# Patient Record
Sex: Female | Born: 2000 | Race: White | Hispanic: No | Marital: Single | Smoking: Never smoker
Health system: Southern US, Community
[De-identification: ages and names within clinical notes are randomized; demographics above are authoritative.]

## PROBLEM LIST (undated history)

## (undated) DIAGNOSIS — F419 Anxiety disorder, unspecified: Secondary | ICD-10-CM

---

## 2019-09-07 ENCOUNTER — Emergency Department (HOSPITAL_BASED_OUTPATIENT_CLINIC_OR_DEPARTMENT_OTHER): Payer: PRIVATE HEALTH INSURANCE

## 2019-09-07 ENCOUNTER — Other Ambulatory Visit: Payer: Self-pay

## 2019-09-07 ENCOUNTER — Emergency Department (HOSPITAL_BASED_OUTPATIENT_CLINIC_OR_DEPARTMENT_OTHER)
Admission: EM | Admit: 2019-09-07 | Discharge: 2019-09-07 | Disposition: A | Discharge status: Home / Self Care | Payer: PRIVATE HEALTH INSURANCE | Attending: Emergency Medicine | Admitting: Emergency Medicine

## 2019-09-07 ENCOUNTER — Encounter (HOSPITAL_BASED_OUTPATIENT_CLINIC_OR_DEPARTMENT_OTHER): Payer: Self-pay | Admitting: Emergency Medicine

## 2019-09-07 DIAGNOSIS — Y999 Unspecified external cause status: Secondary | ICD-10-CM | POA: Insufficient documentation

## 2019-09-07 DIAGNOSIS — Y9389 Activity, other specified: Secondary | ICD-10-CM | POA: Diagnosis not present

## 2019-09-07 DIAGNOSIS — S9002XA Contusion of left ankle, initial encounter: Secondary | ICD-10-CM

## 2019-09-07 DIAGNOSIS — Y9289 Other specified places as the place of occurrence of the external cause: Secondary | ICD-10-CM | POA: Insufficient documentation

## 2019-09-07 DIAGNOSIS — S99912A Unspecified injury of left ankle, initial encounter: Secondary | ICD-10-CM | POA: Diagnosis present

## 2019-09-07 DIAGNOSIS — Z79899 Other long term (current) drug therapy: Secondary | ICD-10-CM | POA: Insufficient documentation

## 2019-09-07 DIAGNOSIS — W208XXA Other cause of strike by thrown, projected or falling object, initial encounter: Secondary | ICD-10-CM | POA: Insufficient documentation

## 2019-09-07 HISTORY — DX: Anxiety disorder, unspecified: F41.9

## 2019-09-07 NOTE — ED Triage Notes (Signed)
Reports a large plank fell on her left ankle around 3pm today.  C/o pain and swelling.

## 2019-09-07 NOTE — Discharge Instructions (Signed)
You may take Tylenol 1000 mg every 6 hours as needed for pain and alternate with ibuprofen 800 mg every 8 hours as needed for pain.  Your x-ray showed no fracture or dislocation.  You may use the Ace wrap to help with pain and swelling.  You may use crutches as needed but you are safe to weight-bear as tolerated.  I suspect that as your soft tissue swelling improves, your numbness will also improve.  I recommend follow-up with sports medicine if symptoms do not seem to be improving with medical therapy.

## 2019-09-07 NOTE — ED Provider Notes (Signed)
TIME SEEN: 1:04 AM  CHIEF COMPLAINT: Left ankle injury  HPI: Patient is a 19 year old female who presents to the emergency department left ankle injury that occurred at 3 PM yesterday afternoon.  States that a large board fell onto her ankle and the back of her Achilles.  She was able to ambulate afterwards.  States she has been using ice and symptoms are improving.  States that she got into a hot tub tonight and pain worsened and it began throbbing.  She feels like her foot is numb.  She has pain now with ambulation.  ROS: See HPI Constitutional: no fever  Eyes: no drainage  ENT: no runny nose   Cardiovascular:  no chest pain  Resp: no SOB  GI: no vomiting GU: no dysuria Integumentary: no rash  Allergy: no hives  Musculoskeletal: no leg swelling  Neurological: no slurred speech ROS otherwise negative  PAST MEDICAL HISTORY/PAST SURGICAL HISTORY:  Past Medical History:  Diagnosis Date  . Anxiety     MEDICATIONS:  Prior to Admission medications   Medication Sig Start Date End Date Taking? Authorizing Provider  ALPRAZolam Duanne Moron) 1 MG tablet Take 1 mg by mouth at bedtime as needed for anxiety.   Yes [provider]  desvenlafaxine (PRISTIQ) 50 MG 24 hr tablet Take 50 mg by mouth daily.   Yes [provider]  methylphenidate 36 MG PO CR tablet Take 78 mg by mouth daily.   Yes [provider]  norethindrone-ethinyl estradiol (LOESTRIN) 1-20 MG-MCG tablet Take 1 tablet by mouth daily.   Yes [provider]    ALLERGIES:  Not on File  SOCIAL HISTORY:  Social History   Tobacco Use  . Smoking status: Never Smoker  . Smokeless tobacco: Never Used  Substance Use Topics  . Alcohol use: Yes    Comment: occasionally    FAMILY HISTORY: No family history on file.  EXAM: BP 133/73 (BP Location: Right Arm)   Pulse (!) 115   Temp 98.4 F (36.9 C) (Oral)   Resp 16   Ht 5\' 5"  (1.651 m)   Wt 59 kg   SpO2 98%   BMI 21.63 kg/m   CONSTITUTIONAL: Alert and oriented and responds appropriately to questions. Well-appearing; well-nourished, nontoxic, in no distress HEAD: Normocephalic EYES: Conjunctivae clear, pupils appear equal, EOM appear intact ENT: normal nose; moist mucous membranes NECK: Supple, normal ROM CARD: Regular and tachycardic; S1 and S2 appreciated; no murmurs, no clicks, no rubs, no gallops RESP: Normal chest excursion without splinting or tachypnea; breath sounds clear and equal bilaterally; no wheezes, no rhonchi, no rales, no hypoxia or respiratory distress, speaking full sentences ABD/GI: Normal bowel sounds; non-distended; soft, non-tender, no rebound, no guarding, no peritoneal signs, no hepatosplenomegaly BACK:  The back appears normal EXT: Patient has soft tissue contusion with abrasion and mild ecchymosis to the medial ankle and over the left Achilles.  Small amount of soft tissue swelling noted.  She has full dorsi and plantar flexion of the left foot.  Normal movement of the left toes.  No ligamentous laxity.  2+ left DP pulse.  Normal capillary refill.  Reports diminished sensation from ankle down her left foot.  No calf tenderness or calf swelling.  No tenderness or deformity over the proximal left fibula.  No redness or warmth.  No fluctuance or induration. SKIN: Normal color for age and race; warm; no rash on exposed skin NEURO: Moves all extremities equally, reports diminished sensation in the ankle and left foot but  normal sensation otherwise in the left leg, normal speech PSYCH: The patient's mood and manner are appropriate.   MEDICAL DECISION MAKING: Patient here with left ankle injury.  She reports feeling that this area is numb but I suspect this is due to soft tissue swelling, neuropraxia from contusion.  She has no lacerations.  Doubt stroke or nerve laceration.  Doubt DVT.  She has a strong DP pulse and normal capillary refill.  No ligamentous laxity on exam and no sign of Achilles  rupture.  X-ray here shows no fracture, dislocation and has been independently reviewed and interpreted by myself.  Offered Ace wrap for comfort and compression.  Will provide crutches to use as needed.  Recommended alternating Tylenol and Motrin for pain.  Offered pain medication here which she declines.  No other sign of injury on exam.  I feel she is safe to be discharged home and can follow-up with sports medicine as needed if symptoms not improving.  At this time, I do not feel there is any life-threatening condition present. I have reviewed, interpreted and discussed all results (EKG, imaging, lab, urine as appropriate) and exam findings with patient/family. I have reviewed nursing notes and appropriate previous records.  I feel the patient is safe to be discharged home without further emergent workup and can continue workup as an outpatient as needed. Discussed usual and customary return precautions. Patient/family verbalize understanding and are comfortable with this plan.  Outpatient follow-up has been provided as needed. All questions have been answered.      Kendra Mills was evaluated in Emergency Department on 09/07/2019 for the symptoms described in the history of present illness. She was evaluated in the context of the global COVID-19 pandemic, which necessitated consideration that the patient might be at risk for infection with the SARS-CoV-2 virus that causes COVID-19. Institutional protocols and algorithms that pertain to the evaluation of patients at risk for COVID-19 are in a state of rapid change based on information released by regulatory bodies including the CDC and federal and state organizations. These policies and algorithms were followed during the patient's care in the ED.      Kendra Mills, Layla Maw, DO 09/07/19 [phone]

## 2021-03-29 IMAGING — DX DG ANKLE COMPLETE 3+V*L*
3 series · 3 of 3 positions shown · non-contrast
Comparison: None.

CLINICAL DATA: Blunt trauma to the ankle, initial encounter

EXAM:
LEFT ANKLE COMPLETE - 3+ VIEW

[ankle ap]
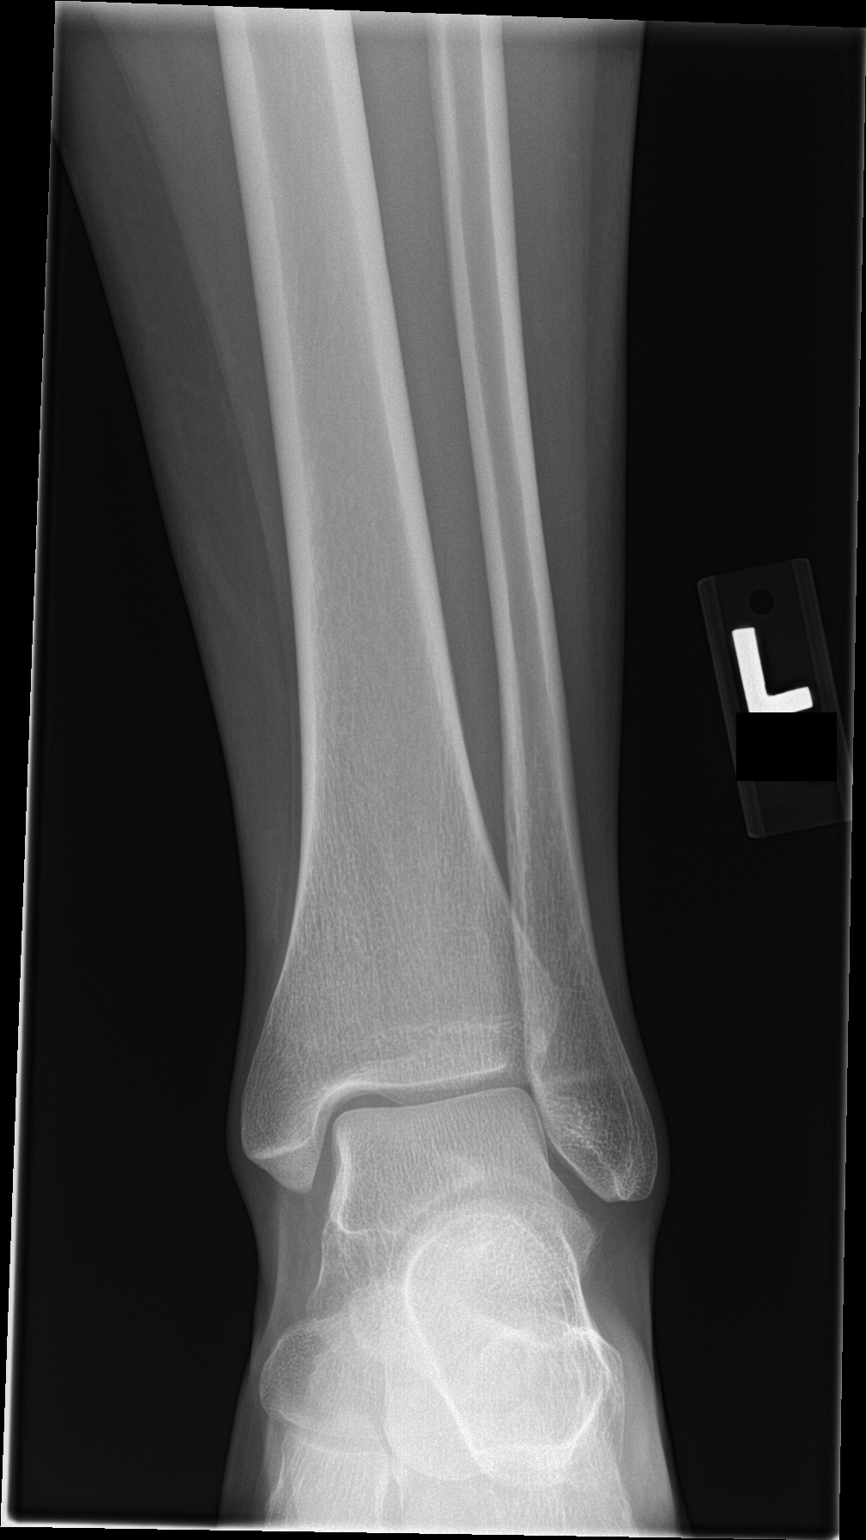

[ankle obl]
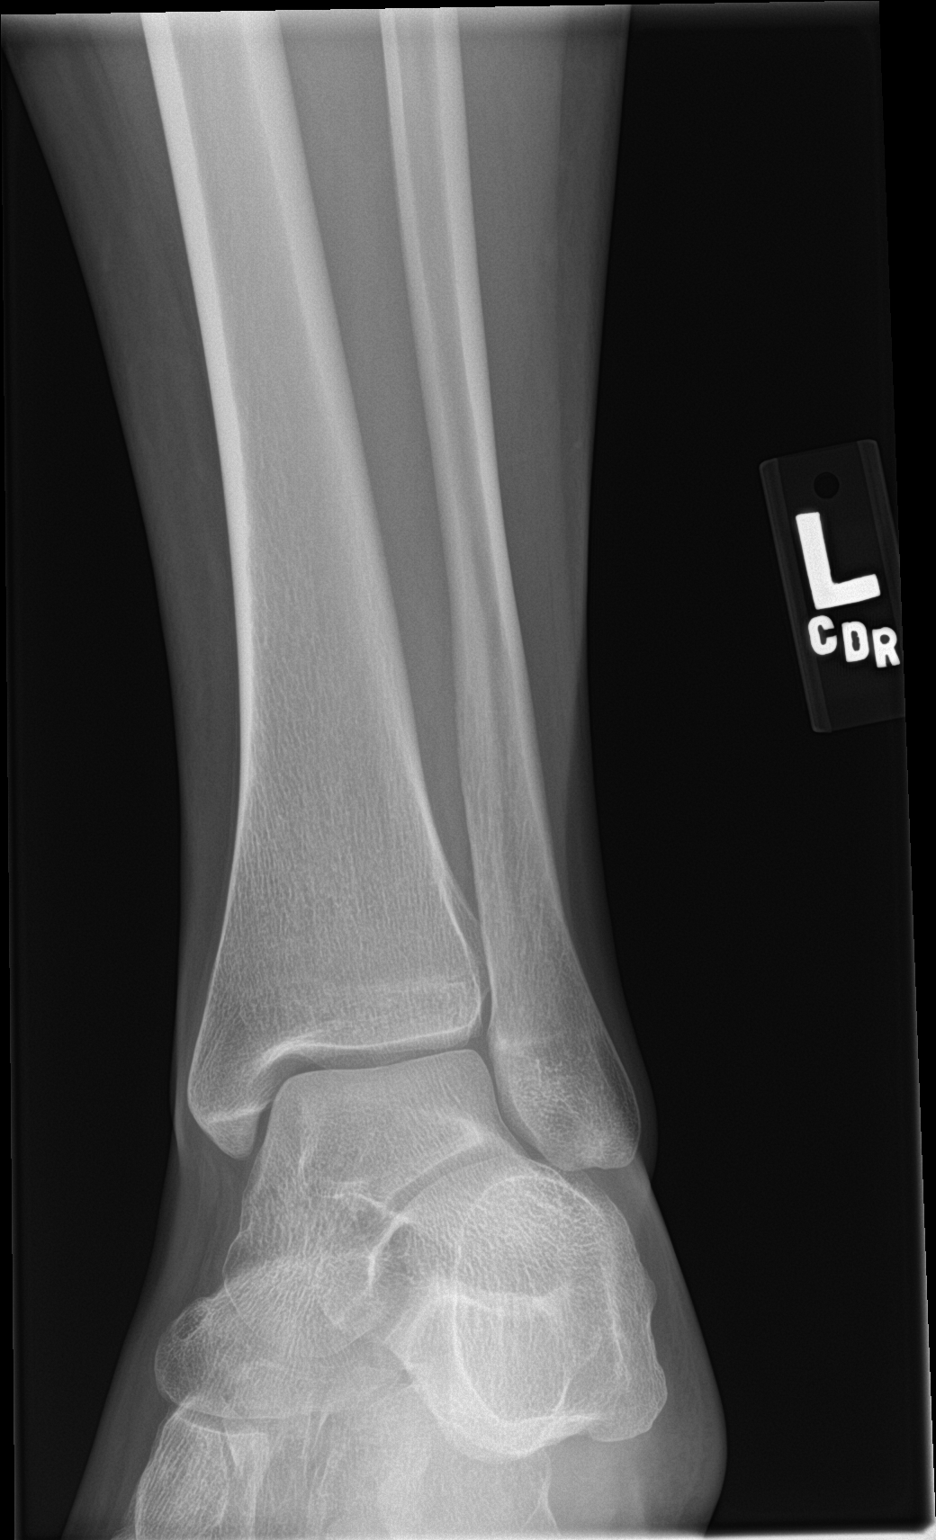

[ankle lat]
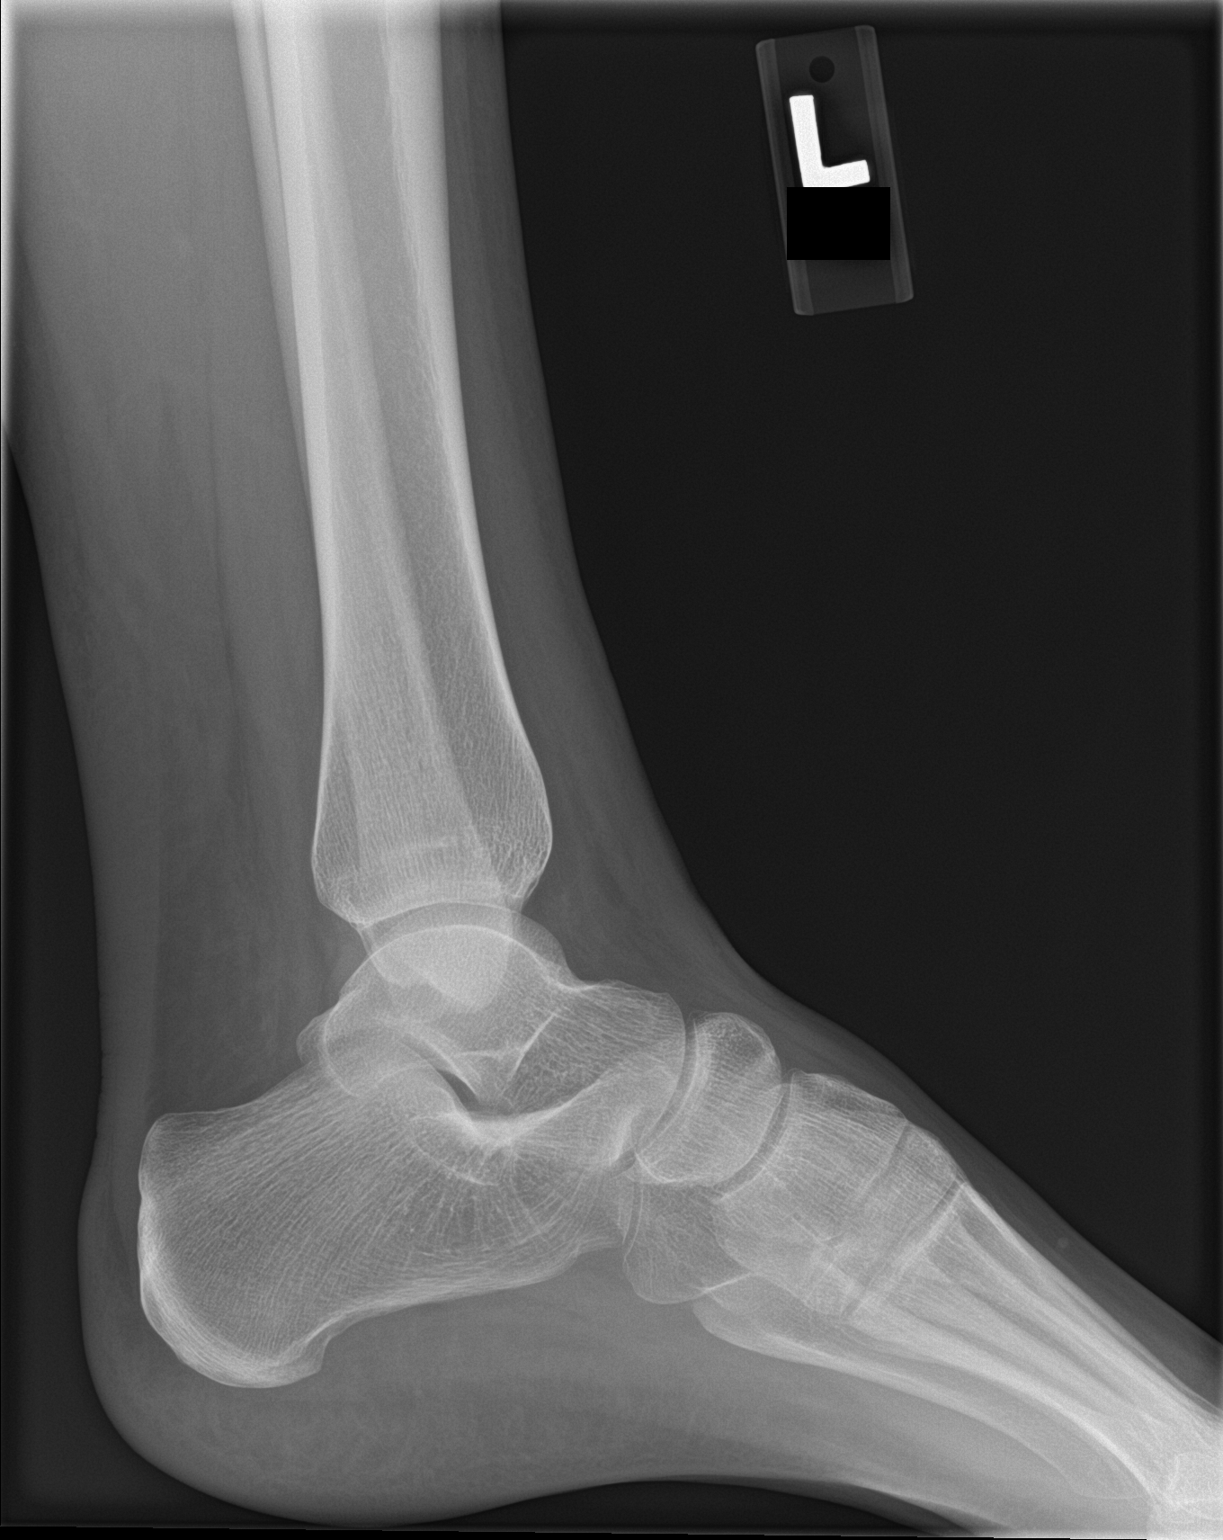

[3 of 3 positions shown; findings below may reference images not displayed]

FINDINGS: There is no evidence of fracture, dislocation, or joint effusion.
There is no evidence of arthropathy or other focal bone abnormality.
Soft tissues are unremarkable.
IMPRESSION: No acute abnormality noted.
